# Patient Record
Sex: Female | Born: 1935 | Race: White | Hispanic: No | Marital: Married
Health system: Southern US, Community
[De-identification: ages and names within clinical notes are randomized; demographics above are authoritative.]

---

## 2008-12-14 ENCOUNTER — Ambulatory Visit: Payer: Self-pay | Admitting: General Surgery

## 2008-12-24 ENCOUNTER — Ambulatory Visit: Payer: Self-pay | Admitting: General Surgery

## 2008-12-31 ENCOUNTER — Ambulatory Visit: Payer: Self-pay | Admitting: General Surgery

## 2009-01-03 ENCOUNTER — Ambulatory Visit: Payer: Self-pay | Admitting: Oncology

## 2009-01-07 ENCOUNTER — Ambulatory Visit: Payer: Self-pay | Admitting: General Surgery

## 2009-01-19 ENCOUNTER — Ambulatory Visit: Payer: Self-pay | Admitting: Oncology

## 2009-02-02 ENCOUNTER — Ambulatory Visit: Payer: Self-pay | Admitting: Oncology

## 2009-03-05 ENCOUNTER — Ambulatory Visit: Payer: Self-pay | Admitting: Oncology

## 2009-03-09 ENCOUNTER — Ambulatory Visit: Payer: Self-pay | Admitting: Oncology

## 2009-04-05 ENCOUNTER — Ambulatory Visit: Payer: Self-pay | Admitting: Oncology

## 2009-05-03 ENCOUNTER — Ambulatory Visit: Payer: Self-pay | Admitting: Oncology

## 2009-05-18 ENCOUNTER — Ambulatory Visit: Payer: Self-pay | Admitting: Oncology

## 2009-06-03 ENCOUNTER — Ambulatory Visit: Payer: Self-pay | Admitting: Oncology

## 2009-08-03 ENCOUNTER — Ambulatory Visit: Payer: Self-pay | Admitting: Oncology

## 2009-08-17 ENCOUNTER — Ambulatory Visit: Payer: Self-pay | Admitting: Oncology

## 2009-09-02 ENCOUNTER — Ambulatory Visit: Payer: Self-pay | Admitting: Oncology

## 2009-10-03 ENCOUNTER — Ambulatory Visit: Payer: Self-pay | Admitting: Oncology

## 2009-10-17 ENCOUNTER — Ambulatory Visit: Payer: Self-pay | Admitting: Oncology

## 2009-11-03 ENCOUNTER — Ambulatory Visit: Payer: Self-pay | Admitting: Oncology

## 2009-12-03 ENCOUNTER — Ambulatory Visit: Payer: Self-pay | Admitting: Oncology

## 2009-12-17 LAB — CANCER ANTIGEN 27.29: CA 27.29: 38.7 U/mL — ABNORMAL HIGH (ref 0.0–38.6)

## 2010-01-03 ENCOUNTER — Ambulatory Visit: Payer: Self-pay | Admitting: Oncology

## 2010-02-10 ENCOUNTER — Ambulatory Visit: Payer: Self-pay | Admitting: Oncology

## 2010-02-11 LAB — CANCER ANTIGEN 27.29: CA 27.29: 44.2 U/mL — ABNORMAL HIGH (ref 0.0–38.6)

## 2010-03-05 ENCOUNTER — Ambulatory Visit: Payer: Self-pay | Admitting: Oncology

## 2010-04-07 ENCOUNTER — Ambulatory Visit: Payer: Self-pay | Admitting: Oncology

## 2010-05-04 ENCOUNTER — Ambulatory Visit: Payer: Self-pay | Admitting: Oncology

## 2010-06-04 ENCOUNTER — Ambulatory Visit: Payer: Self-pay | Admitting: Oncology

## 2010-07-28 ENCOUNTER — Ambulatory Visit: Payer: Self-pay | Admitting: Oncology

## 2010-08-04 ENCOUNTER — Ambulatory Visit: Payer: Self-pay | Admitting: Oncology

## 2010-08-23 ENCOUNTER — Ambulatory Visit: Payer: Self-pay | Admitting: Oncology

## 2010-09-22 ENCOUNTER — Ambulatory Visit: Payer: Self-pay | Admitting: Oncology

## 2010-10-04 ENCOUNTER — Ambulatory Visit: Payer: Self-pay | Admitting: Oncology

## 2010-11-04 ENCOUNTER — Ambulatory Visit: Payer: Self-pay | Admitting: Oncology

## 2010-11-18 LAB — CANCER ANTIGEN 27.29: CA 27.29: 69.7 U/mL — ABNORMAL HIGH (ref 0.0–38.6)

## 2010-12-04 ENCOUNTER — Ambulatory Visit: Payer: Self-pay | Admitting: Oncology

## 2011-01-04 ENCOUNTER — Ambulatory Visit: Payer: Self-pay | Admitting: Oncology

## 2011-01-14 ENCOUNTER — Inpatient Hospital Stay: Payer: Self-pay | Admitting: Internal Medicine

## 2011-02-03 ENCOUNTER — Ambulatory Visit: Payer: Self-pay | Admitting: Oncology

## 2011-02-03 ENCOUNTER — Ambulatory Visit: Payer: Self-pay | Admitting: Internal Medicine

## 2011-02-12 ENCOUNTER — Inpatient Hospital Stay: Payer: Self-pay | Admitting: Specialist

## 2011-03-06 ENCOUNTER — Ambulatory Visit: Payer: Self-pay | Admitting: Internal Medicine

## 2011-03-06 DEATH — deceased

## 2012-07-25 IMAGING — CR DG CHEST 1V PORT
1 series · 1 of 1 positions shown · non-contrast
Comparison: none

REASON FOR EXAM: UPRIGHT; HEMATEMESIS
COMMENTS:

PROCEDURE:     DXR - DXR PORTABLE CHEST SINGLE VIEW  - January 14, 2011  [DATE]
RESULT:     Comparison: None

[view not recorded]
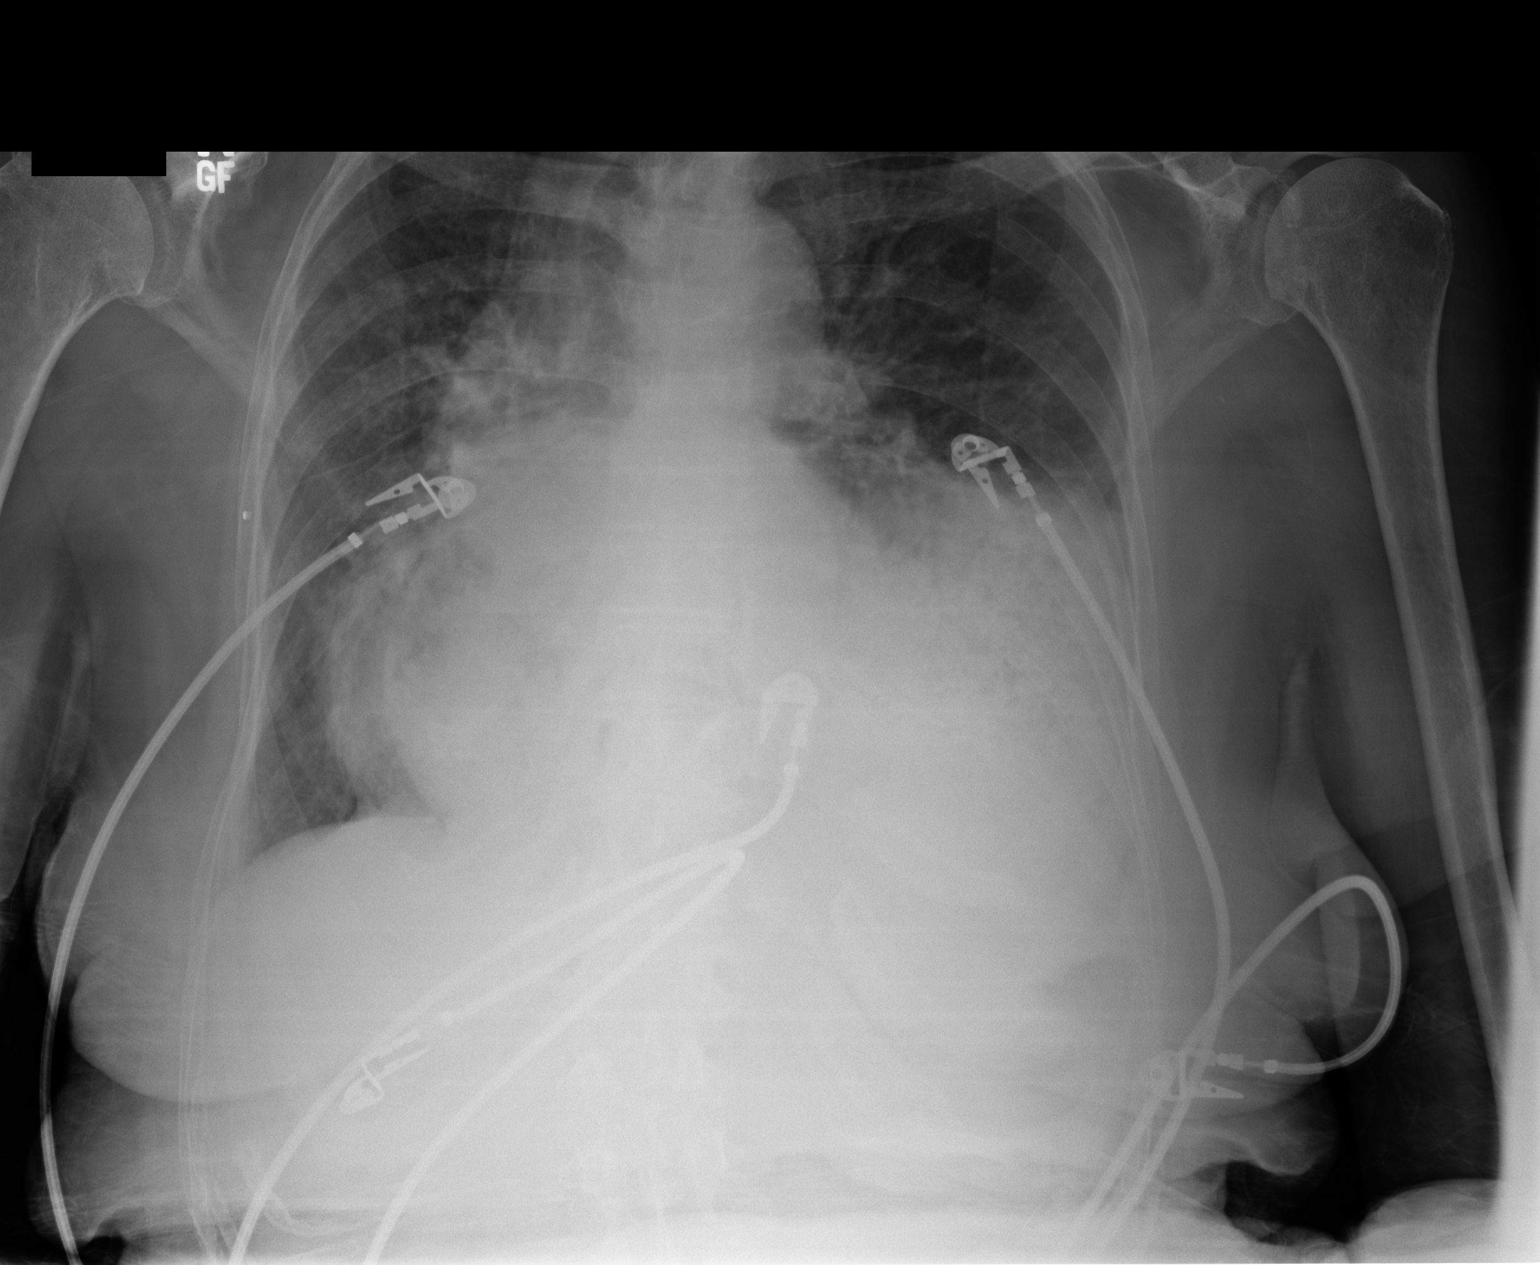

[1 of 1 positions shown; findings below may reference images not displayed]

FINDINGS: Single portable AP chest radiograph is provided. Again noted is a large
hiatal hernia. There is no focal parenchymal opacity, pleural effusion, or
pneumothorax. The heart size is enlarged.. The osseous structures are
unremarkable.
IMPRESSION: No acute disease of the chest.

## 2012-08-23 IMAGING — CR DG CHEST 1V PORT
1 series · 1 of 1 positions shown · non-contrast
Comparison: none

REASON FOR EXAM: Shortness of Breath
COMMENTS:

PROCEDURE:     DXR - DXR PORTABLE CHEST SINGLE VIEW  - February 12, 2011  [DATE]
RESULT:     Severe cardiomegaly is present. Prominent hiatal hernia is most
likely present. Basilar atelectasis and/or pneumonia is noted. Pulmonary
vascularity is normal. Chest is stable from 01/16/2011.

[ap]
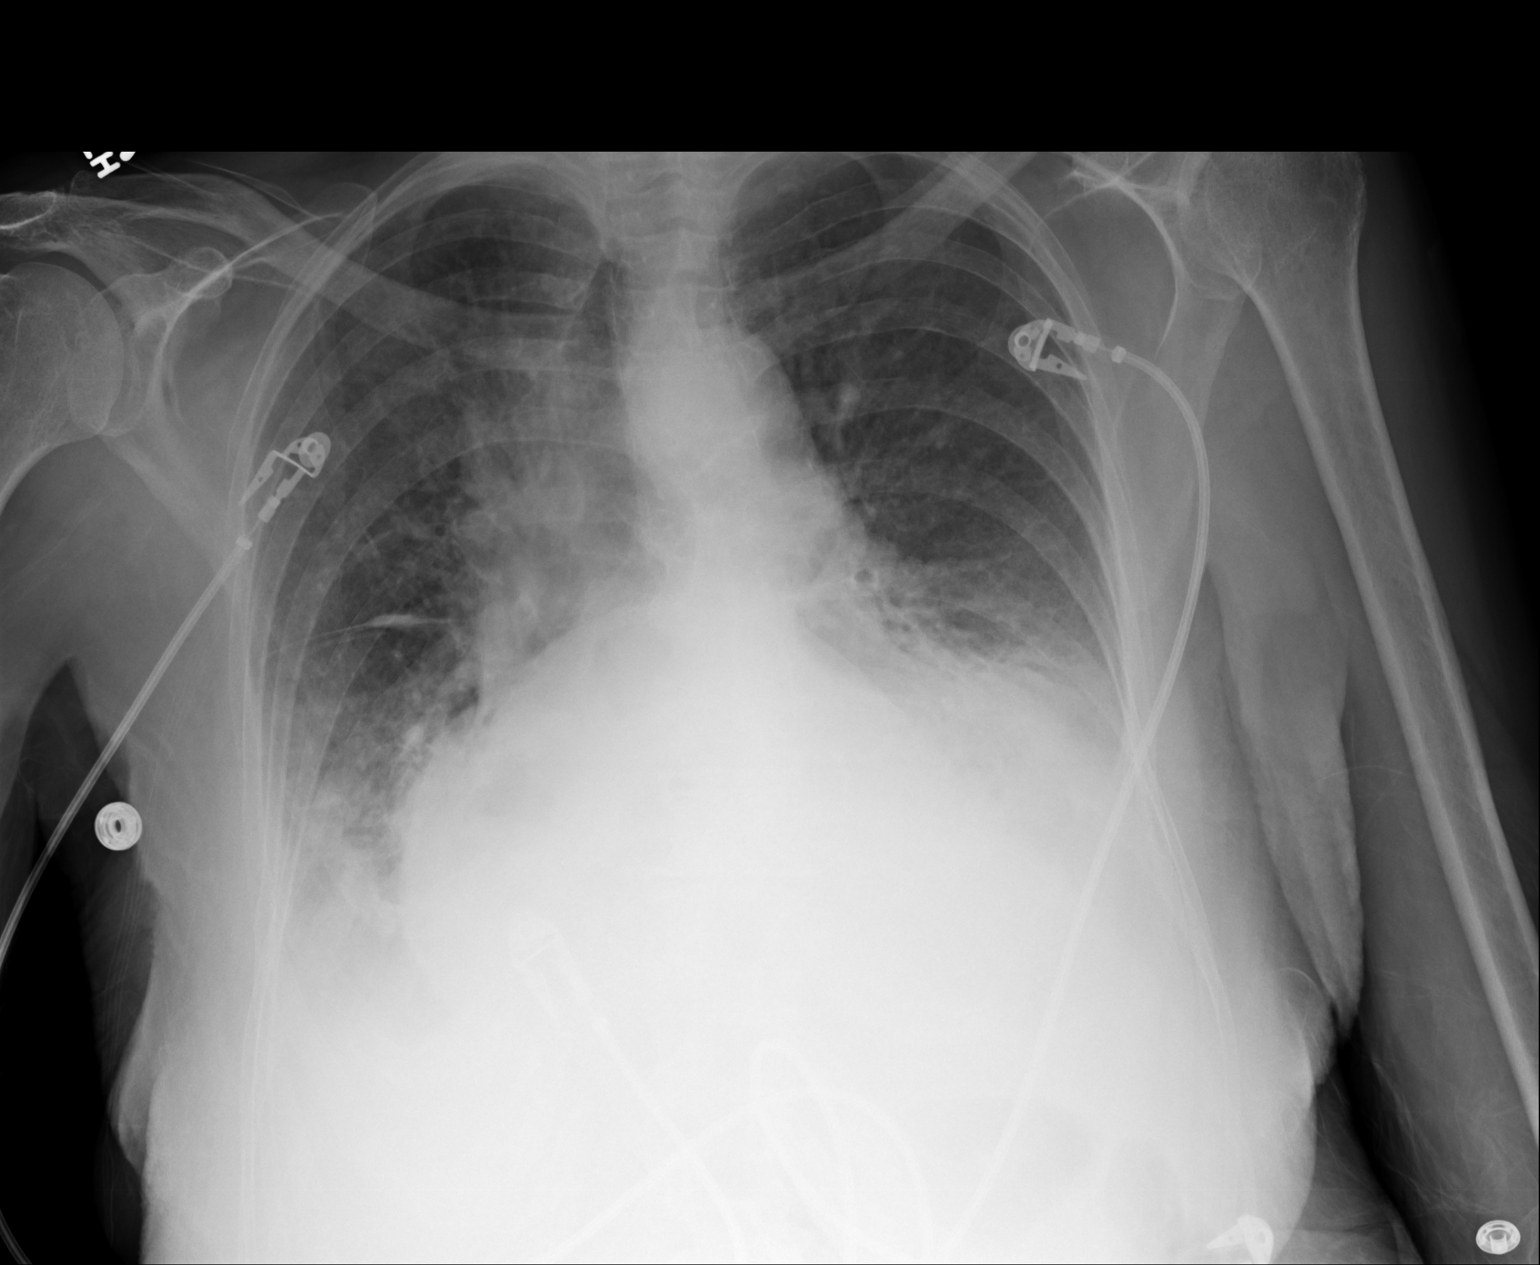

[1 of 1 positions shown; findings below may reference images not displayed]

IMPRESSION: Stable chest with large hiatal hernia. There is cardiomegaly with mild
basilar interstitial prominence. Superposed pneumonia and congestive heart
failure should be considered.

## 2014-06-27 NOTE — Consult Note (Signed)
PATIENT NAME:  Rebecca Johns, Rebecca Johns MR#:  [id number] DATE OF BIRTH:  13-Mar-1926  DATE OF CONSULTATION:  02/12/2011  REFERRING PHYSICIAN:  Bari Edward, MD CONSULTING PHYSICIAN:  Myran Arcia D. Akio Hudnall, MD  INDICATION: Shortness of breath, altered mental status.   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 She was hospitalized and during her hospitalization she had complications with paroxysmal atrial fibrillation with elevated troponin and respiratory failure thought to be secondary to bronchitis versus pneumonia. She was treated medically. He was found to be cyanotic and unresponsive by nursing staff at [facility] with altered mental status and unable to give much of a history. According to the family, she was sent in for further evaluation. She was given oxygen in the past and has not been able to tolerate it. She complains of getting significantly short of breath. She has had lower extremity edema which was treated medically and had significant improvement of her symptoms, but she continues to have significant altered mental status and shortness of breath and was admitted for further evaluation and care.   REVIEW OF SYSTEMS: She may have had blackout spells. She may have had syncope. No clear nausea or vomiting. No fever. No chills. No sweats. No weight gain. No hemoptysis, hematemesis, or bright red blood per rectum. She has pain, weakness, fatigue, bronchitis, and history of atrial fibrillation.   PAST MEDICAL HISTORY:  1. Gastrointestinal bleed. 2. Anemia. 3. Breast cancer. 4. Atrial fibrillation.  5. Borderline elevated troponins. 6. Segmented non-Q-wave myocardial infarction, possibly demand ischemia. 7. Respiratory  failure. 8. Bronchitis. 9. Pneumonia.  10. Anxiety. 11. Urge incontinence.  12. Anemia, thrombocytopenia. 13. Chronic dermatitis.  14. Hypertension.  15. Dementia.   PAST SURGICAL HISTORY:  1. Esophageal banding. 2. Breast biopsy.   ALLERGIES: No known drug allergies.   MEDICATIONS:  1. Oxybutynin 5 mg twice a day.  2. Protonix 40 mg twice a day.  3. Femara 2.5 mg daily.  4. Peri-Colace 1 daily. 5. Lidoderm patch 5% every 12 hours. 6. Nadolol 80 mg daily. 7. Oxygen 4 liters nasal cannula. 8. DuoNebs every six hours as needed.  9. Xanax 0.25 mg one tablet four times daily. 10. Lasix 20 mg daily. 11. Altace 5 mg twice a day.   FAMILY HISTORY: History of heart disease, cancer, and Wegener's disease.   SOCIAL HISTORY: She lives at [facility] now. No history of smoking or alcohol consumption.  PHYSICAL EXAMINATION:   VITAL SIGNS: Blood pressure 190/80, pulse 75, respiratory rate 20, and afebrile.   HEENT: Normocephalic, atraumatic. Pupils equal and reactive to light.   NECK: Supple. No significant jugular venous distention, bruits, or adenopathy.   LUNGS: Bilateral rhonchi with bilateral rales. Occasional expiratory wheezing. Adequate air movement.   HEART: Normal sinus rhythm. Soft systolic ejection murmur at sternal border. PMI is nondisplaced.   ABDOMEN: Benign.   EXTREMITIES: Examination is within normal limits.   NEURO: Normal.  SKIN: Normal.   LABS/STUDIES: CK 63, MB negative, and troponin 0.04. BNP [id number]. White count 7, hemoglobin 10.4, hematocrit 31, and platelet count 138. Glucose 147, BUN 5, creatinine 0.91, and sodium 139. LFTs negative.   EKG: Normal sinus rhythm, rate of 70, nonspecific ST-T changes.   Chest x-ray: Pulmonary vascular congestion and possible right basilar infiltrates.   ASSESSMENT:  1. Acute hypoxemia, respiratory failure.  2. Altered mental status.  3. Paroxysmal atrial fibrillation.  4. Anemia. 5. History of  gastrointestinal bleeding.  6. Metastatic breast cancer. 7. Anxiety.  8. Mild dementia.   PLAN: Agree with admit. Rule out for myocardial infarction. Continue respiratory support. Continue supplemental oxygen. Continue ACE inhibitor. Continue Lasix and Nadolol. Continue SVN therapy and inhalers. Continue broad-spectrum antibiotics for possible bronchitis. Echocardiogram may or may not be helpful. We will follow up cardiac enzymes and followup EKGs. Continue evaluation for altered mental status, possible encephalopathy. We will decrease  Celexa as she may be overmedicated, for anxiety. Continue rate control for atrial fibrillation. Follow-up hemoglobin and hematocrit for bleeding. Continue Protonix twice a day, for reflux and possible varices. For metastatic breast cancer, she is still on Femara and Lidoderm patch for pain. We will continue those. The patient is a DO NOT RESUSCITATE for now and on hospice at [facility]. We will continue to treat the patient appropriately in the interim.  ____________________________ Bobbie Stackwayne D. Juliann Paresallwood, MD ddc:slb D: 02/18/2011 11:53:27 ET T: 02/19/2011 08:51:49 ET JOB#: [id number]  cc: Allesandra Huebsch D. Juliann Paresallwood, MD, <Dictator> Alwyn PeaWAYNE D Alishea Beaudin MD ELECTRONICALLY SIGNED 03/15/2011 5:54

## 2014-06-27 NOTE — Consult Note (Signed)
PATIENT NAME:  Rebecca Johns, Rebecca Johns MR#:  [id number] DATE OF BIRTH:  [birth year]  DATE OF CONSULTATION:  01/14/2011  REFERRING PHYSICIAN:  Dr. Oswaldo DoneFinnegan/Dr. Phichith  CONSULTING PHYSICIAN:  Jemya Depierro Johns. Juliann Paresallwood, MD  PRIMARY CARE PHYSICIAN: Dr. Judithann GravesBerglund   INDICATION: Shortness of breath, respiratory failure, congestive heart failure with atrial fibrillation.   HISTORY OF PRESENT ILLNESS: Rebecca Johns is an 81 year old white female with history of metastatic cancer of the spine from the breast, anxiety, anemia, hypertension, dementia presented with bloody vomitus x2 with diarrhea. There has been some baseline dementia, unable to provide much of the history but two nieces who care for her state that she has had no recent symptoms except for the above complaints. She had consistent back pain and also some abdominal pain. She denied any significant chest pain but had some significant shortness of breath as well.   REVIEW OF SYSTEMS: Denies blackout spells, syncope. She has had nausea and vomiting and diarrhea. No fever. No chills. No sweats. No weight loss. No weight gain. No hemoptysis, hematemesis. No bright red blood per rectum. No vision change or hearing change. She has had hematemesis and mild hemoptysis. A few dark stools but no weight loss.   PAST MEDICAL HISTORY:  1. Breast cancer metastases. 2. Anxiety. 3. Urinary urgency.  4. Anemia.  5. Chronic dermatitis.  6. Hypertension.  7. Dementia.   ALLERGIES: None.   MEDICATIONS:  1. Metoprolol ER 25 mg at bedtime.  2. Oxybutynin 5 mg twice a day.  3. Peri-Colace 1 tablet every day. 4. Topical cream on legs twice a day.  5. Xanax 0.25 twice a day. 6. Femara 2.5 mg once a day.  7. Zometa IV every two months. 8. Ibuprofen 200 mg 3 times a day.  9. Advil 2 tablets daily.   PAST SURGICAL HISTORY: Breast biopsy.   FAMILY HISTORY: Heart disease, cancer, Wegner's.     SOCIAL HISTORY[place] She has a caregiver.   PHYSICAL  EXAMINATION:  VITAL SIGNS: Blood pressure 150/64, pulse 88, respiratory 16, afebrile.   HEENT: Normocephalic, atraumatic. Pupils reactive to light.   NECK: Supple. No jugular venous distention, bruits, adenopathy.   LUNGS: Clear to auscultation and percussion. No significant wheeze, rhonchi, rale.  HEART: Regular rate and rhythm, tachycardic, irregular. Systolic ejection murmur left sternal border. PMI nondisplaced.   ABDOMEN: Benign.   EXTREMITY EXAM: Within normal limits.   LABORATORY, DIAGNOSTIC AND RADIOLOGICAL DATA: White count 7.9, hemoglobin 11.1, hematocrit 32.5, platelet count 129, lipase 82, glucose 171, BUN 16, creatinine 0.8, sodium 140, potassium 3.8. LFTs negative. INR 1.2, troponin 0.07. EKG: Atrial fibrillation, rate of 81, normal sinus rhythm, nonspecific ST-T wave changes.   ASSESSMENT:  1. Gastrointestinal bleed. 2. Atrial fibrillation. 3. Hypertension. 4. Shortness of breath. 5. Mild heart failure. 6. Metastatic breast cancer.  7. Anxiety.  8. Anemia.   PLAN: Agree with admit. Place on telemetry. Follow up atrial fibrillation. Continue rate control. Is not a good candidate for anticoagulation. Recommend GI evaluation and therapy, possibly with scope. Recommend metoprolol just for rate control and blood pressure control. Would discontinue Advil as well as other non-steroidal anti-inflammatory medications which may be contributing to the bleeding. Will consider echocardiogram. Will continue to follow patient and see how she responds.  ____________________________ Bobbie Stackwayne Johns. Juliann Paresallwood, MD ddc:cms Johns: 01/21/2011 20:29:37 ET T: 01/22/2011 11:32:49 ET JOB#: [id number]  cc: Tisa Weisel Johns. Juliann Paresallwood, MD, <Dictator> Alwyn PeaWAYNE Johns Jerusalen Mateja MD ELECTRONICALLY SIGNED 03/09/2011 13:44
